# Patient Record
Sex: Female | Born: 1990 | Race: Asian | Hispanic: No | Marital: Married | Smoking: Never smoker
Health system: Southern US, Community
[De-identification: ages and names within clinical notes are randomized; demographics above are authoritative.]

## PROBLEM LIST (undated history)

## (undated) DIAGNOSIS — E079 Disorder of thyroid, unspecified: Secondary | ICD-10-CM

## (undated) DIAGNOSIS — E039 Hypothyroidism, unspecified: Secondary | ICD-10-CM

## (undated) HISTORY — PX: NO PAST SURGERIES: SHX2092

## (undated) HISTORY — DX: Disorder of thyroid, unspecified: E07.9

---

## 2014-08-23 ENCOUNTER — Ambulatory Visit (INDEPENDENT_AMBULATORY_CARE_PROVIDER_SITE_OTHER): Payer: PPO | Admitting: Physician Assistant

## 2014-08-23 VITALS — BP 112/70 | HR 73 | Temp 97.7°F | Ht 60.5 in | Wt 161.0 lb

## 2014-08-23 DIAGNOSIS — B9789 Other viral agents as the cause of diseases classified elsewhere: Secondary | ICD-10-CM

## 2014-08-23 DIAGNOSIS — J069 Acute upper respiratory infection, unspecified: Secondary | ICD-10-CM | POA: Diagnosis not present

## 2014-08-23 DIAGNOSIS — J029 Acute pharyngitis, unspecified: Secondary | ICD-10-CM

## 2014-08-23 LAB — POCT RAPID STREP A (OFFICE): RAPID STREP A SCREEN: NEGATIVE

## 2014-08-23 MED ORDER — NAPROXEN 500 MG PO TABS: 500.0000 mg | ORAL_TABLET | Freq: Two times a day (BID) | ORAL | Status: DC

## 2014-08-23 MED ORDER — CETIRIZINE-PSEUDOEPHEDRINE ER 5-120 MG PO TB12: 1.0000 | ORAL_TABLET | ORAL | Status: AC

## 2014-08-23 MED ORDER — HYDROCODONE-HOMATROPINE 5-1.5 MG/5ML PO SYRP: 5.0000 mL | ORAL_SOLUTION | Freq: Every day | ORAL | Status: DC

## 2014-08-23 NOTE — Progress Notes (Signed)
08/23/2014 at 8:21 PM  Sitara Raden / DOB: 1990-11-17 / MRN: [id number]  The patient  does not have a problem list on file.  SUBJECTIVE  Chief compalaint: Sore Throat   History of present illness: Ms. Boberg is 24 y.o. well appearing female presenting for viral uri symptoms that started yesterday.  She complains of sore throat, headache, and rhinorrhea, and nasal congestion and cough.  She denies fever, nausea, rigor, changes in appetite, or change in bowel or bladder.  She has tried aleve with mild relief of her throat pain.    She  has no past medical history on file.    She has a current medication list which includes the following prescription(s): naproxen sodium.  Ms. Schalk has No Known Allergies. She  reports that she has never smoked. She does not have any smokeless tobacco history on file. She reports that she does not drink alcohol or use illicit drugs. She  has no sexual activity history on file. The patient  has no past surgical history on file.  Her family history is not on file.  ROS  Per HPI   OBJECTIVE  Her  height is 5' 0.5" (1.537 m) and weight is 161 lb (73.029 kg). Her oral temperature is 97.7 F (36.5 C). Her blood pressure is 112/70 and her pulse is 73. Her oxygen saturation is 100%.  The patient's body mass index is 30.91 kg/(m^2).  Physical Exam  Constitutional: She is oriented to person, place, and time. She appears well-developed and well-nourished. No distress.  HENT:  Right Ear: Hearing, tympanic membrane, external ear and ear canal normal.  Left Ear: Hearing, tympanic membrane, external ear and ear canal normal.  Nose: Mucosal edema present. Right sinus exhibits no maxillary sinus tenderness and no frontal sinus tenderness. Left sinus exhibits no maxillary sinus tenderness and no frontal sinus tenderness.  Mouth/Throat: Uvula is midline, oropharynx is clear and moist and mucous membranes are normal.    Cardiovascular: Normal rate, regular rhythm  and normal heart sounds.   Respiratory: Effort normal and breath sounds normal. She has no wheezes. She has no rales.  Neurological: She is alert and oriented to person, place, and time.  Skin: Skin is warm and dry. She is not diaphoretic.  Psychiatric: She has a normal mood and affect.    Results for orders placed or performed in visit on 08/23/14 (from the past 24 hour(s))  POCT rapid strep A     Status: None   Collection Time: 08/23/14  8:00 PM  Result Value Ref Range   Rapid Strep A Screen Negative Negative    ASSESSMENT & PLAN  Lavonna was seen today for sore throat.  Diagnoses and all orders for this visit:  Sore throat: Culture pending.  Will call patient if results are positive and treat appropriately.   Orders: -     POCT rapid strep A -     Culture, Group A Strep -     naproxen (NAPROSYN) 500 MG tablet; Take 1 tablet (500 mg total) by mouth 2 (two) times daily with a meal.  Viral URI with cough Orders: -     HYDROcodone-homatropine (HYCODAN) 5-1.5 MG/5ML syrup; Take 5 mLs by mouth at bedtime. -     cetirizine-pseudoephedrine (ZYRTEC-D ALLERGY & CONGESTION) 5-120 MG per tablet; Take 1 tablet by mouth every morning. Do not use OTC cold remedies with this medication.    The patient was advised to call or come back to clinic if she does  not see an improvement in symptoms, or worsens with the above plan.   Deliah BostonMichael Clark, MHS, PA-C [facility] 08/23/2014 8:21 PM

## 2014-08-23 NOTE — Addendum Note (Signed)
Addended by: Ofilia NeasLARK, Lindy Pennisi L on: 08/23/2014 08:51 PM   Modules accepted: Level of Service

## 2014-08-23 NOTE — Patient Instructions (Signed)
Upper Respiratory Infection, Adult An upper respiratory infection (URI) is also sometimes known as the common cold. The upper respiratory tract includes the nose, sinuses, throat, trachea, and bronchi. Bronchi are the airways leading to the lungs. Most people improve within 1 week, but symptoms can last up to 2 weeks. A residual cough may last even longer.  CAUSES Many different viruses can infect the tissues lining the upper respiratory tract. The tissues become irritated and inflamed and often become very moist. Mucus production is also common. A cold is contagious. You can easily spread the virus to others by oral contact. This includes kissing, sharing a glass, coughing, or sneezing. Touching your mouth or nose and then touching a surface, which is then touched by another person, can also spread the virus. SYMPTOMS  Symptoms typically develop 1 to 3 days after you come in contact with a cold virus. Symptoms vary from person to person. They may include:  Runny nose.  Sneezing.  Nasal congestion.  Sinus irritation.  Sore throat.  Loss of voice (laryngitis).  Cough.  Fatigue.  Muscle aches.  Loss of appetite.  Headache.  Low-grade fever. DIAGNOSIS  You might diagnose your own cold based on familiar symptoms, since most people get a cold 2 to 3 times a year. Your caregiver can confirm this based on your exam. Most importantly, your caregiver can check that your symptoms are not due to another disease such as strep throat, sinusitis, pneumonia, asthma, or epiglottitis. Blood tests, throat tests, and X-rays are not necessary to diagnose a common cold, but they may sometimes be helpful in excluding other more serious diseases. Your caregiver will decide if any further tests are required. RISKS AND COMPLICATIONS  You may be at risk for a more severe case of the common cold if you smoke cigarettes, have chronic heart disease (such as heart failure) or lung disease (such as asthma), or if  you have a weakened immune system. The very young and very old are also at risk for more serious infections. Bacterial sinusitis, middle ear infections, and bacterial pneumonia can complicate the common cold. The common cold can worsen asthma and chronic obstructive pulmonary disease (COPD). Sometimes, these complications can require emergency medical care and may be life-threatening. PREVENTION  The best way to protect against getting a cold is to practice good hygiene. Avoid oral or hand contact with people with cold symptoms. Wash your hands often if contact occurs. There is no clear evidence that vitamin C, vitamin E, echinacea, or exercise reduces the chance of developing a cold. However, it is always recommended to get plenty of rest and practice good nutrition. TREATMENT  Treatment is directed at relieving symptoms. There is no cure. Antibiotics are not effective, because the infection is caused by a virus, not by bacteria. Treatment may include:  Increased fluid intake. Sports drinks offer valuable electrolytes, sugars, and fluids.  Breathing heated mist or steam (vaporizer or shower).  Eating chicken soup or other clear broths, and maintaining good nutrition.  Getting plenty of rest.  Using gargles or lozenges for comfort.  Controlling fevers with ibuprofen or acetaminophen as directed by your caregiver.  Increasing usage of your inhaler if you have asthma. Zinc gel and zinc lozenges, taken in the first 24 hours of the common cold, can shorten the duration and lessen the severity of symptoms. Pain medicines may help with fever, muscle aches, and throat pain. A variety of non-prescription medicines are available to treat congestion and runny nose. Your caregiver   can make recommendations and may suggest nasal or lung inhalers for other symptoms.  HOME CARE INSTRUCTIONS   Only take over-the-counter or prescription medicines for pain, discomfort, or fever as directed by your  caregiver.  Use a warm mist humidifier or inhale steam from a shower to increase air moisture. This may keep secretions moist and make it easier to breathe.  Drink enough water and fluids to keep your urine clear or pale yellow.  Rest as needed.  Return to work when your temperature has returned to normal or as your caregiver advises. You may need to stay home longer to avoid infecting others. You can also use a face mask and careful hand washing to prevent spread of the virus. SEEK MEDICAL CARE IF:   After the first few days, you feel you are getting worse rather than better.  You need your caregiver's advice about medicines to control symptoms.  You develop chills, worsening shortness of breath, or brown or red sputum. These may be signs of pneumonia.  You develop yellow or brown nasal discharge or pain in the face, especially when you bend forward. These may be signs of sinusitis.  You develop a fever, swollen neck glands, pain with swallowing, or white areas in the back of your throat. These may be signs of strep throat. SEEK IMMEDIATE MEDICAL CARE IF:   You have a fever.  You develop severe or persistent headache, ear pain, sinus pain, or chest pain.  You develop wheezing, a prolonged cough, cough up blood, or have a change in your usual mucus (if you have chronic lung disease).  You develop sore muscles or a stiff neck. Document Released: 12/03/2000 Document Revised: 09/01/2011 Document Reviewed: 09/14/2013 [employer] Patient Information 2015 [employer]. This information is not intended to replace advice given to you by your health care provider. Make sure you discuss any questions you have with your health care provider.  

## 2014-08-26 LAB — CULTURE, GROUP A STREP: ORGANISM ID, BACTERIA: NORMAL

## 2014-09-05 ENCOUNTER — Other Ambulatory Visit: Payer: Self-pay | Admitting: Obstetrics and Gynecology

## 2014-09-05 DIAGNOSIS — E049 Nontoxic goiter, unspecified: Secondary | ICD-10-CM

## 2014-09-06 ENCOUNTER — Other Ambulatory Visit: Payer: Self-pay

## 2014-09-08 ENCOUNTER — Inpatient Hospital Stay: Admission: RE | Admit: 2014-09-08 | Payer: Self-pay | Source: Ambulatory Visit

## 2014-09-25 ENCOUNTER — Ambulatory Visit
Admission: RE | Admit: 2014-09-25 | Discharge: 2014-09-25 | Disposition: A | Discharge status: Home / Self Care | Payer: PPO | Source: Ambulatory Visit | Attending: Obstetrics and Gynecology | Admitting: Obstetrics and Gynecology

## 2014-09-25 DIAGNOSIS — E049 Nontoxic goiter, unspecified: Secondary | ICD-10-CM

## 2015-02-04 ENCOUNTER — Emergency Department (INDEPENDENT_AMBULATORY_CARE_PROVIDER_SITE_OTHER)
Admission: EM | Admit: 2015-02-04 | Discharge: 2015-02-04 | Disposition: A | Discharge status: Home / Self Care | Payer: PPO | Source: Home / Self Care | Attending: Family Medicine | Admitting: Family Medicine

## 2015-02-04 ENCOUNTER — Encounter (HOSPITAL_COMMUNITY): Payer: Self-pay | Admitting: Emergency Medicine

## 2015-02-04 DIAGNOSIS — R21 Rash and other nonspecific skin eruption: Secondary | ICD-10-CM | POA: Diagnosis not present

## 2015-02-04 MED ORDER — PREDNISOLONE 5 MG (21) PO TBPK: 1.0000 | ORAL_TABLET | ORAL | Status: DC

## 2015-02-04 NOTE — ED Notes (Signed)
Pt comes in with bumps to hands/ feet and knees x 2 days Pt is taking Thyroid medication

## 2015-02-04 NOTE — ED Provider Notes (Signed)
CSN: [id number]     Arrival date & time 02/04/15  1923 History   First MD Initiated Contact with Patient 02/04/15 1929     Chief Complaint  Patient presents with  . Rash   (Consider location/radiation/quality/duration/timing/severity/associated sxs/prior Treatment) HPI Comments: Patient presents with a pruritic rash to both hands, arms, legs and feet. No known exposures. No new medications. Was working in yard, though not for long. Onset 2 days, very itchy. No additional symptoms and otherwise feels well. No prior history of rash.   Patient is a 24 y.o. female presenting with rash. The history is provided by the patient.  Rash   History reviewed. No pertinent past medical history. History reviewed. No pertinent past surgical history. No family history on file. Social History  Substance Use Topics  . Smoking status: Never Smoker   . Smokeless tobacco: None  . Alcohol Use: No   OB History    No data available     Review of Systems  Skin: Positive for rash.  All other systems reviewed and are negative.   Allergies  Review of patient's allergies indicates no known allergies.  Home Medications   Prior to Admission medications   Medication Sig Start Date End Date Taking? Authorizing Provider  HYDROcodone-homatropine (HYCODAN) 5-1.5 MG/5ML syrup Take 5 mLs by mouth at bedtime. 08/23/14   Ofilia Neas, PA-C  naproxen (NAPROSYN) 500 MG tablet Take 1 tablet (500 mg total) by mouth 2 (two) times daily with a meal. 08/23/14   Ofilia Neas, PA-C  naproxen sodium (ANAPROX) 220 MG tablet Take 220 mg by mouth 2 (two) times daily with a meal.    Historical Provider, MD  PrednisoLONE 5 MG (21) TBPK Take 1 Package by mouth as directed. Take daily as directed on package 02/04/15   Riki Sheer, PA-C   BP 104/73 mmHg  Pulse 94  Temp(Src) 99.1 F (37.3 C) (Oral)  Resp 16  SpO2 99% Physical Exam  Constitutional: She is oriented to person, place, and time. She appears well-developed  and well-nourished. No distress.  HENT:  Head: Normocephalic and atraumatic.  Neurological: She is alert and oriented to person, place, and time.  Skin: Skin is warm and dry. Rash noted. She is not diaphoretic.  Macular rash to hands, arms, legs and feet. No plaques or patches noted. No vesicles.   Psychiatric: Her behavior is normal.  Nursing note and vitals reviewed.   ED Course  Procedures (including critical care time) Labs Review Labs Reviewed - No data to display  Imaging Review No results found.   MDM   1. Rash and nonspecific skin eruption    Etiology unclear. Treat as a topical dermatitis with pred pack. If worsens or continues, suggest consult with a dermatologist.     Riki Sheer, PA-C 02/04/15 1956

## 2015-02-04 NOTE — Discharge Instructions (Signed)
Contact Dermatitis Contact dermatitis is a rash that happens when something touches the skin. You touched something that irritates your skin, or you have allergies to something you touched. HOME CARE   Avoid the thing that caused your rash.  Keep your rash away from hot water, soap, sunlight, chemicals, and other things that might bother it.  Do not scratch your rash.  You can take cool baths to help stop itching.  Only take medicine as told by your doctor.  Keep all doctor visits as told. GET HELP RIGHT AWAY IF:   Your rash is not better after 3 days.  Your rash gets worse.  Your rash is puffy (swollen), tender, red, sore, or warm.  You have problems with your medicine. MAKE SURE YOU:   Understand these instructions.  Will watch your condition.  Will get help right away if you are not doing well or get worse. Document Released: 04/06/2009 Document Revised: 09/01/2011 Document Reviewed: 11/12/2010 [facility] Patient Information 2015 [place], [place]. This information is not intended to replace advice given to you by your health care provider. Make sure you discuss any questions you have with your health care provider.  Complete your pred pack. Use Benadryl 25 mg every 4-6 hours for itching if needed. Hope this helps.

## 2015-05-26 ENCOUNTER — Ambulatory Visit (INDEPENDENT_AMBULATORY_CARE_PROVIDER_SITE_OTHER): Payer: PPO | Admitting: Family Medicine

## 2015-05-26 ENCOUNTER — Ambulatory Visit (INDEPENDENT_AMBULATORY_CARE_PROVIDER_SITE_OTHER): Payer: PPO

## 2015-05-26 VITALS — BP 110/65 | HR 120 | Temp 101.2°F | Resp 18 | Ht 61.0 in | Wt 164.6 lb

## 2015-05-26 DIAGNOSIS — R059 Cough, unspecified: Secondary | ICD-10-CM

## 2015-05-26 DIAGNOSIS — R05 Cough: Secondary | ICD-10-CM

## 2015-05-26 DIAGNOSIS — R509 Fever, unspecified: Secondary | ICD-10-CM | POA: Diagnosis not present

## 2015-05-26 LAB — POCT CBC
Granulocyte percent: 56.3 %G (ref 37–80)
HCT, POC: 37.9 % (ref 37.7–47.9)
Hemoglobin: 13.1 g/dL (ref 12.2–16.2)
Lymph, poc: 3.2 (ref 0.6–3.4)
MCH, POC: 28.7 pg (ref 27–31.2)
MCHC: 34.5 g/dL (ref 31.8–35.4)
MCV: 83.2 fL (ref 80–97)
MID (CBC): 0.6 (ref 0–0.9)
MPV: 8.4 fL (ref 0–99.8)
PLATELET COUNT, POC: 226 10*3/uL (ref 142–424)
POC Granulocyte: 4.9 (ref 2–6.9)
POC LYMPH %: 36.7 % (ref 10–50)
POC MID %: 7 %M (ref 0–12)
RBC: 4.56 M/uL (ref 4.04–5.48)
RDW, POC: 12.4 %
WBC: 8.7 10*3/uL (ref 4.6–10.2)

## 2015-05-26 LAB — POCT INFLUENZA A/B
INFLUENZA A, POC: NEGATIVE
INFLUENZA B, POC: NEGATIVE

## 2015-05-26 LAB — POCT RAPID STREP A (OFFICE): RAPID STREP A SCREEN: NEGATIVE

## 2015-05-26 MED ORDER — HYDROCODONE-HOMATROPINE 5-1.5 MG/5ML PO SYRP: 5.0000 mL | ORAL_SOLUTION | Freq: Three times a day (TID) | ORAL | Status: DC | PRN

## 2015-05-26 MED ORDER — ACETAMINOPHEN 325 MG PO TABS: 1000.0000 mg | ORAL_TABLET | Freq: Once | ORAL | Status: DC

## 2015-05-26 MED ORDER — IBUPROFEN 200 MG PO TABS: 400.0000 mg | ORAL_TABLET | Freq: Once | ORAL | Status: AC

## 2015-05-26 MED ORDER — CEFDINIR 300 MG PO CAPS: 300.0000 mg | ORAL_CAPSULE | Freq: Two times a day (BID) | ORAL | Status: DC

## 2015-05-26 NOTE — Patient Instructions (Signed)
Start on your antibiotic tonight and use the cough syrup as needed for cough Drink plenty of fluids  Use tylenol and / or ibuprofen to keep your fever down If you are not feeling a good bit better tomorrow please come back for a recheck- if you get worse overnight or if you are not able to drink go to the ER

## 2015-05-26 NOTE — Progress Notes (Signed)
[facility] [address], [place] [address] [phone]  Date:  05/26/2015   Name:  Cheryl Murray   DOB:  1991-03-11   MRN:  [id number]  PCP:  No PCP Per Patient    Chief Complaint: Cough and Headache   History of Present Illness:  Cheryl Murray is a 24 y.o. very pleasant female patient who presents with the following:  She has had a cough for about 10days, and then ha for 3 days Today she felt very tired and cold She has body aches Also sore throat  No antipyretics today so far  NKDA She took her thyroid medication only so far today  LMP 11/20 No belly pain or vomiting No urinary sx  There are no active problems to display for this patient.   Past Medical History  Diagnosis Date  . Thyroid disease     History reviewed. No pertinent past surgical history.  Social History  Substance Use Topics  . Smoking status: Never Smoker   . Smokeless tobacco: None  . Alcohol Use: No    History reviewed. No pertinent family history.  No Known Allergies  Medication list has been reviewed and updated.  No current outpatient prescriptions on file prior to visit.   No current facility-administered medications on file prior to visit.    Review of Systems:  As per HPI- otherwise negative.   Physical Examination: Filed Vitals:   05/26/15 1550  BP: 94/62  Pulse: 133  Temp: 103.1 F (39.5 C)  Resp: 18   Filed Vitals:   05/26/15 1550  Height: 5\' 1"  (1.549 m)  Weight: 164 lb 9.6 oz (74.662 kg)   Body mass index is 31.12 kg/(m^2). Ideal Body Weight: Weight in (lb) to have BMI = 25: 132  GEN: WDWN, NAD, Non-toxic, A & O x 3, coughing, febrile HEENT: Atraumatic, Normocephalic. Neck supple. No masses, No LAD.  Bilateral TM wnl, oropharynx normal.  PEERL,EOMI.   Ears and Nose: No external deformity. CV: RRR, No M/G/R. No JVD. No thrill. No extra heart sounds. PULM: CTA B, no wheezes, crackles, rhonchi. No retractions. No resp. distress.  No accessory muscle use. ABD: S, NT, ND, +BS. No rebound. No HSM. EXTR: No c/c/e NEURO Normal gait.  PSYCH: Normally interactive. Conversant. Not depressed or anxious appearing.  Calm demeanor.   Treated wht 400 mg of ibuprofen and 1,000 mg of tylenol po in clinic.    Results for orders placed or performed in visit on 05/26/15  POCT Influenza A/B  Result Value Ref Range   Influenza A, POC Negative Negative   Influenza B, POC Negative Negative  POCT rapid strep A  Result Value Ref Range   Rapid Strep A Screen Negative Negative  POCT CBC  Result Value Ref Range   WBC 8.7 4.6 - 10.2 K/uL   Lymph, poc 3.2 0.6 - 3.4   POC LYMPH PERCENT 36.7 10 - 50 %L   MID (cbc) 0.6 0 - 0.9   POC MID % 7.0 0 - 12 %M   POC Granulocyte 4.9 2 - 6.9   Granulocyte percent 56.3 37 - 80 %G   RBC 4.56 4.04 - 5.48 M/uL   Hemoglobin 13.1 12.2 - 16.2 g/dL   HCT, POC 69.637.9 29.537.7 - 47.9 %   MCV 83.2 80 - 97 fL   MCH, POC 28.7 27 - 31.2 pg   MCHC 34.5 31.8 - 35.4 g/dL   RDW, POC 28.412.4 %   Platelet Count,  POC 226 142 - 424 K/uL   MPV 8.4 0 - 99.8 fL    [facility] reading (PRIMARY) by  Dr. Patsy Lager. CXR:  Negative   CHEST 2 VIEW  COMPARISON: None.  FINDINGS: The heart size and mediastinal contours are normal. There is patchy left basilar airspace opacity, best seen on the frontal examination. There is no obscuration of the left heart border, and this is likely in the lower lobe. The right lung is clear. There is no pleural effusion or pneumothorax. No acute osseous findings are seen.  IMPRESSION: Patchy left lower lobe airspace disease suspicious for early pneumonia.  Assessment and Plan: Cough - Plan: POCT Influenza A/B, POCT rapid strep A, POCT CBC, DG Chest 2 View, cefdinir (OMNICEF) 300 MG capsule, HYDROcodone-homatropine (HYCODAN) 5-1.5 MG/5ML syrup  Fever, unspecified - Plan: acetaminophen (TYLENOL) tablet 975 mg, ibuprofen (ADVIL,MOTRIN) tablet 400 mg  Here today with high fever, malaise.   Suspicious for flu but test is negative Decided to cover with omnicef in case bacterial origin.   Received CXR over-read as above- continue omnicef for possible pneumonia   Signed Abbe Amsterdam, MD

## 2015-05-27 ENCOUNTER — Telehealth: Payer: Self-pay | Admitting: Family Medicine

## 2015-05-27 NOTE — Telephone Encounter (Signed)
Called and spoke with pt and her husband.  She is a bit better.  Explained that she does have pneumonia.  They will continue the abx

## 2015-05-28 ENCOUNTER — Telehealth: Payer: Self-pay

## 2015-05-28 NOTE — Telephone Encounter (Signed)
Aneima -

## 2015-06-02 ENCOUNTER — Ambulatory Visit (INDEPENDENT_AMBULATORY_CARE_PROVIDER_SITE_OTHER): Payer: PPO | Admitting: Physician Assistant

## 2015-06-02 VITALS — BP 108/68 | HR 107 | Temp 98.0°F | Resp 16 | Ht 61.0 in | Wt 164.0 lb

## 2015-06-02 DIAGNOSIS — R059 Cough, unspecified: Secondary | ICD-10-CM

## 2015-06-02 DIAGNOSIS — R05 Cough: Secondary | ICD-10-CM | POA: Diagnosis not present

## 2015-06-02 MED ORDER — HYDROCOD POLST-CPM POLST ER 10-8 MG/5ML PO SUER: 5.0000 mL | Freq: Every evening | ORAL | Status: AC | PRN

## 2015-06-02 NOTE — Progress Notes (Signed)
[facility] [address], [place] [address] [phone]  Date:  06/02/2015   Name:  Cheryl Murray   DOB:  Sep 27, 1990   MRN:  [id number]  PCP:  No PCP Per Patient    History of Present Illness:  Cheryl Murray is a 24 y.o. female patient who [facility]. She was seen here 7 days ago for fever, cough, and with cxr with possible pneumonia.  Given cefdinir.  She states that she continues to have cough.  She has some fatigue, but is also managing with a sick 25 year old.  Cough is non-productive.  No sob or dyspnea.  No fever.  She states the hycodan does not settle her cough.     There are no active problems to display for this patient.   Past Medical History  Diagnosis Date  . Thyroid disease     History reviewed. No pertinent past surgical history.  Social History  Substance Use Topics  . Smoking status: Never Smoker   . Smokeless tobacco: Never Used  . Alcohol Use: No    History reviewed. No pertinent family history.  No Known Allergies  Medication list has been reviewed and updated.  Current Outpatient Prescriptions on File Prior to Visit  Medication Sig Dispense Refill  . cefdinir (OMNICEF) 300 MG capsule Take 1 capsule (300 mg total) by mouth 2 (two) times daily. 20 capsule 0  . levothyroxine (SYNTHROID, LEVOTHROID) 112 MCG tablet Take 112 mcg by mouth daily before breakfast.     Current Facility-Administered Medications on File Prior to Visit  Medication Dose Route Frequency Provider Last Rate Last Dose  . acetaminophen (TYLENOL) tablet 975 mg  975 mg Oral Once Jessica C Copland, MD      . ibuprofen (ADVIL,MOTRIN) tablet 400 mg  400 mg Oral Once Jessica C Copland, MD        ROS ROS otherwise unremarkable unless listed above.   Physical Examination: BP 108/68 mmHg  Pulse 107  Temp(Src) 98 F (36.7 C) (Oral)  Resp 16  Ht 5\' 1"  (1.549 m)  Wt 164 lb (74.39 kg)  BMI 31.00 kg/m2  SpO2 97%  LMP  05/13/2015 Ideal Body Weight: Weight in (lb) to have BMI = 25: 132  Physical Exam  Constitutional: She is oriented to person, place, and time. She appears well-developed and well-nourished. No distress.  HENT:  Head: Normocephalic and atraumatic.  Right Ear: Tympanic membrane, external ear and ear canal normal.  Left Ear: Tympanic membrane, external ear and ear canal normal.  Nose: No mucosal edema or rhinorrhea. Right sinus exhibits no maxillary sinus tenderness and no frontal sinus tenderness. Left sinus exhibits no maxillary sinus tenderness and no frontal sinus tenderness.  Mouth/Throat: No uvula swelling. No oropharyngeal exudate, posterior oropharyngeal edema or posterior oropharyngeal erythema.  Eyes: Conjunctivae and EOM are normal. Pupils are equal, round, and reactive to light.  Cardiovascular: Normal rate and regular rhythm.  Exam reveals no gallop, no distant heart sounds and no friction rub.   No murmur heard. Pulmonary/Chest: Effort normal. No respiratory distress. She has no decreased breath sounds. She has no wheezes. She has no rhonchi.  Lymphadenopathy:       Head (right side): No submandibular, no tonsillar, no preauricular and no posterior auricular adenopathy present.       Head (left side): No submandibular, no tonsillar, no preauricular and no posterior auricular adenopathy present.    She has no cervical adenopathy.  Neurological: She is alert and oriented to person, place, and time.  Skin: She is not diaphoretic.  Psychiatric: She has a normal mood and affect. Her behavior is normal.     Assessment and Plan: Cheryl Murray is a 24 y.o. female who is here today for follow up of respiratory infection consistent with pneumonia.  This appears to be residual coughing.  I have changed to tussionex.  Advised to return if her sxs do not imrpove within the next 7-10 days.  Alarming sxs discussed.   Cough - Plan: chlorpheniramine-HYDROcodone (TUSSIONEX PENNKINETIC ER) 10-8  MG/5ML SUER   Trena Platt, PA-C [facility] 06/02/2015 4:36 PM

## 2015-10-10 ENCOUNTER — Encounter: Payer: PPO | Admitting: Obstetrics & Gynecology

## 2016-02-21 LAB — OB RESULTS CONSOLE ANTIBODY SCREEN: Antibody Screen: NEGATIVE

## 2016-02-21 LAB — OB RESULTS CONSOLE GC/CHLAMYDIA
Chlamydia: NEGATIVE
GC PROBE AMP, GENITAL: NEGATIVE

## 2016-02-21 LAB — OB RESULTS CONSOLE ABO/RH: RH Type: POSITIVE

## 2016-02-21 LAB — OB RESULTS CONSOLE HEPATITIS B SURFACE ANTIGEN: Hepatitis B Surface Ag: NEGATIVE

## 2016-02-21 LAB — OB RESULTS CONSOLE HIV ANTIBODY (ROUTINE TESTING): HIV: NONREACTIVE

## 2016-02-21 LAB — OB RESULTS CONSOLE RUBELLA ANTIBODY, IGM: Rubella: IMMUNE

## 2016-06-23 NOTE — L&D Delivery Note (Signed)
Delivery Note  First Stage: Labor onset: 1333 Augmentation : Pitocin augmentation until transition then discontinued  Analgesia /Anesthesia intrapartum: Stadol IV  SROM at 0600- clear amniotic fluid   Second Stage: Complete dilation at 1523 Involuntary pushing at 1520 FHR second stage Cat 1  Delivery of a viable female at 151526 by SNM in direct OA position with left arm  no nuchal cord Cord double clamped after cessation of pulsation, cut by FOB Cord blood sample collected   Third Stage: [facility] intact with 3 VC @ 1531 Placenta disposition: L&D Uterine tone firm / bleeding small  1st degree perineal laceration identified  Anesthesia for repair: lidocaine local  Repair 3.0 vicryl rapide  Est. Blood Loss (mL): 150  Complications: none  Mom to postpartum.  Baby to Couplet care / Skin to Skin.  Newborn: Baby Name: Cheryl Murray  Birth Weight: pending  Apgar Scores: 9/9 Feeding planned: breast   Steward DroneVeronica Nazareth Norenberg BSN, SNM 09/10/2016, 3:52 PM

## 2016-06-28 LAB — OB RESULTS CONSOLE RPR: RPR: NONREACTIVE

## 2016-08-23 IMAGING — US US SOFT TISSUE HEAD/NECK
1 series · 13 of 25 positions shown · non-contrast
Comparison: None.

CLINICAL DATA: Thyroid enlargement by physical examination. History
of hypothyroidism.

EXAM:
THYROID ULTRASOUND
TECHNIQUE: Ultrasound examination of the thyroid gland and adjacent soft
tissues was performed.

[Series 1: us soft tissue head/neck · 0.10mm/px · 13 of 57 slices shown]
[im 1/57]
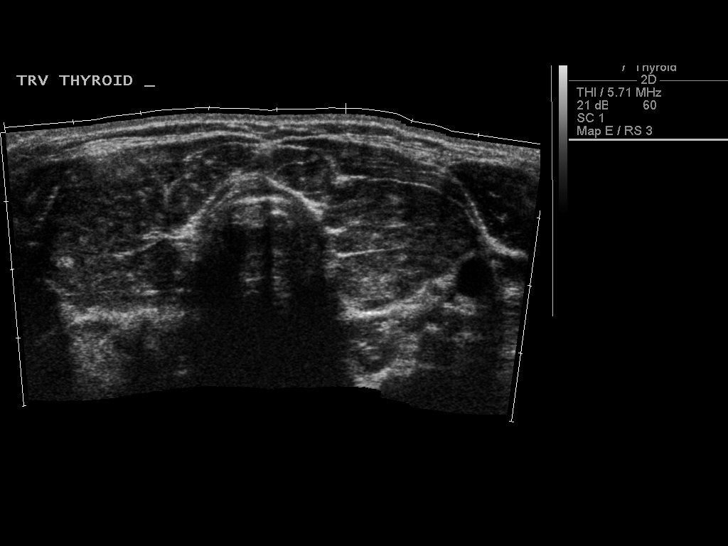
[im 5/57]
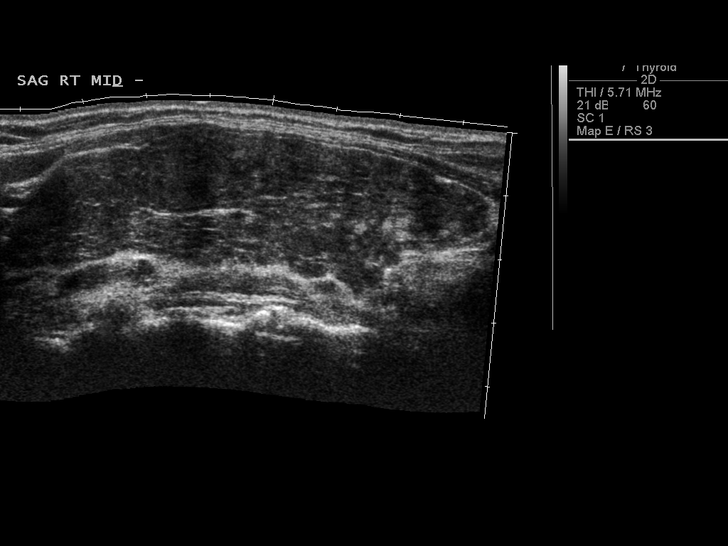
[im 10/57]
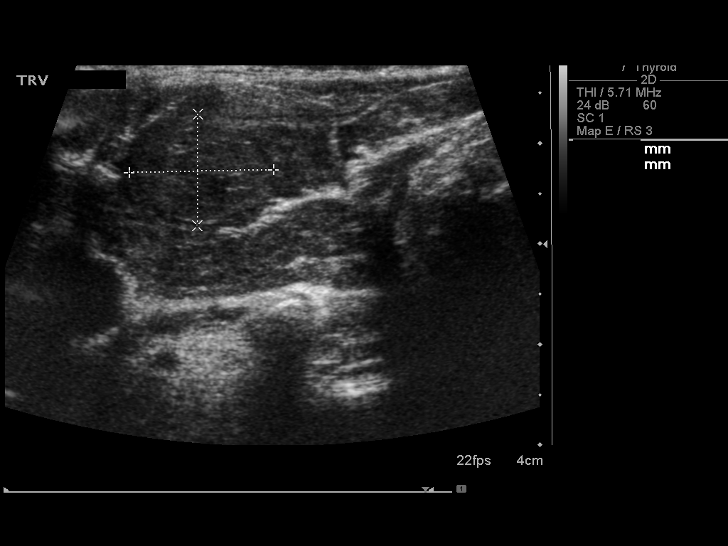
[im 15/57]
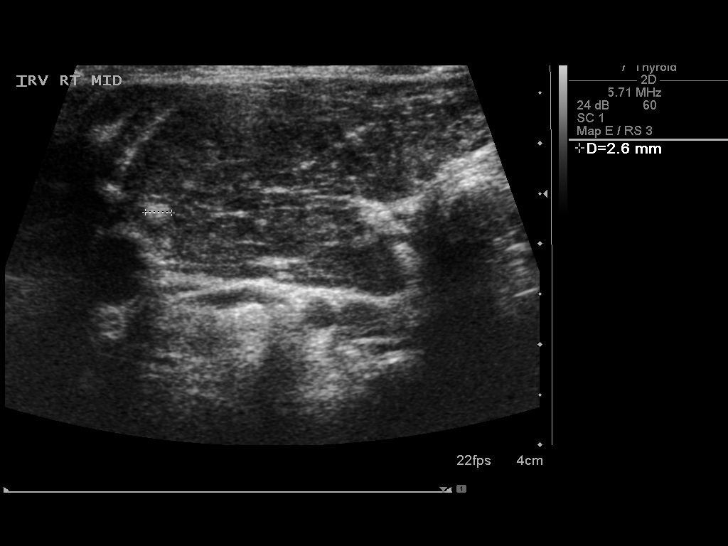
[im 19/57]
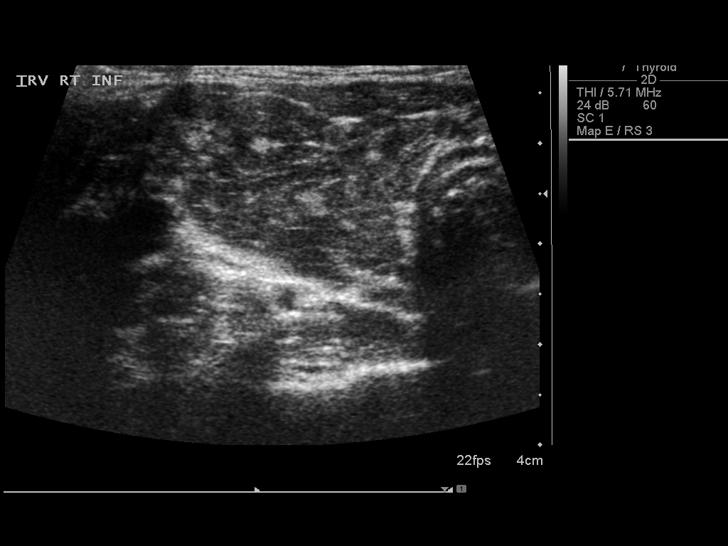
[im 24/57]
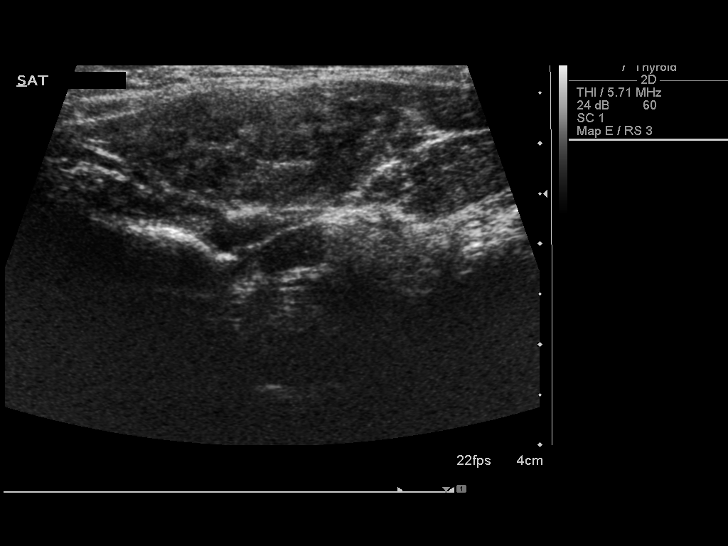
[im 29/57]
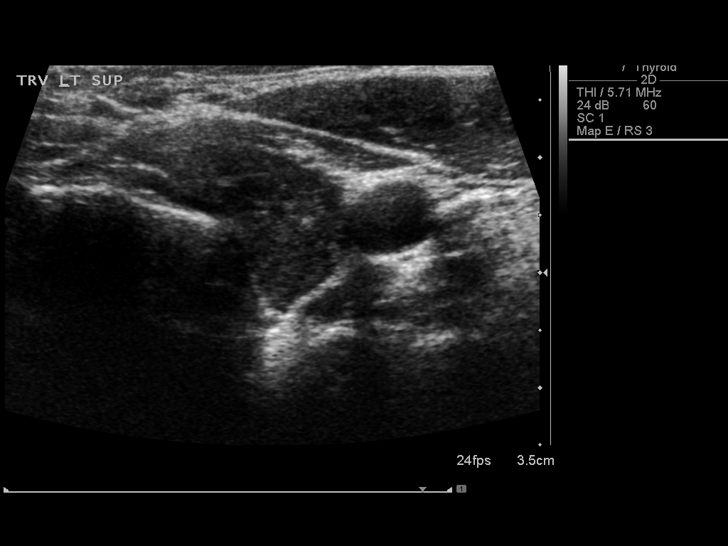
[im 33/57]
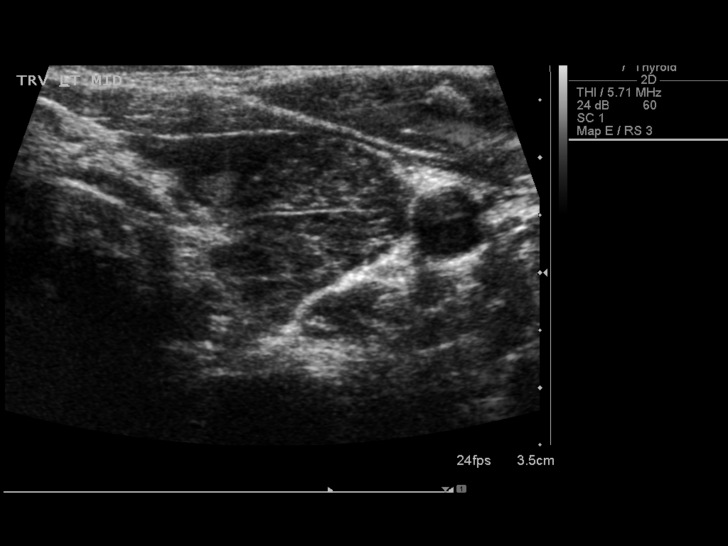
[im 38/57]
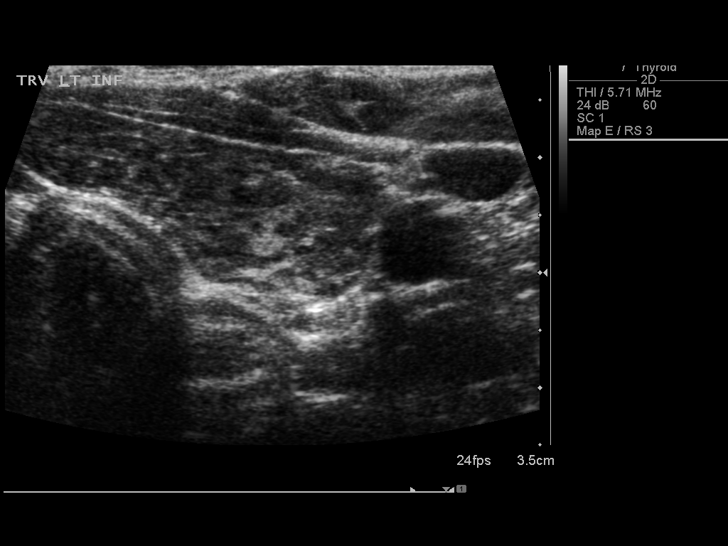
[im 43/57]
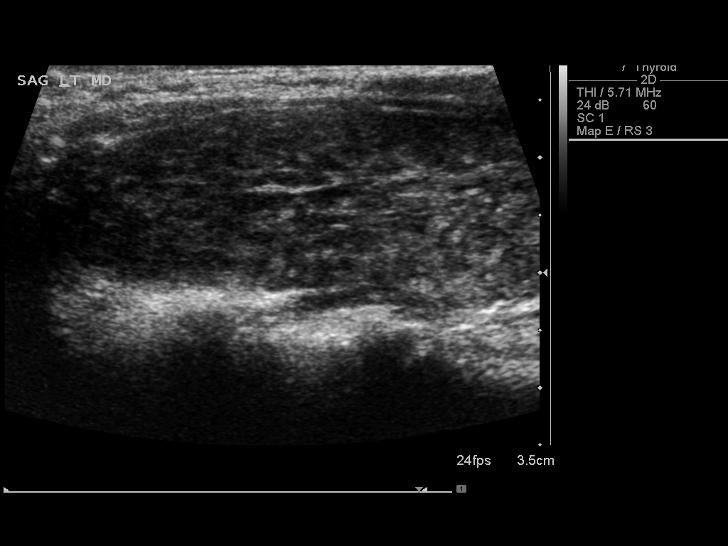
[im 47/57]
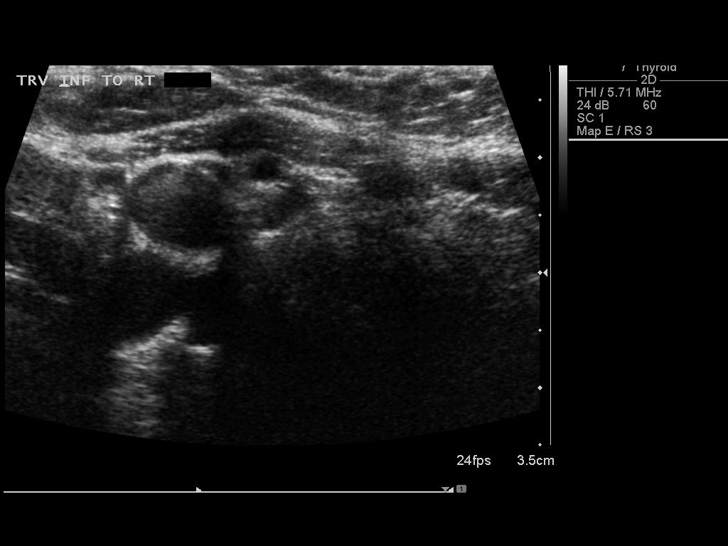
[im 52/57]
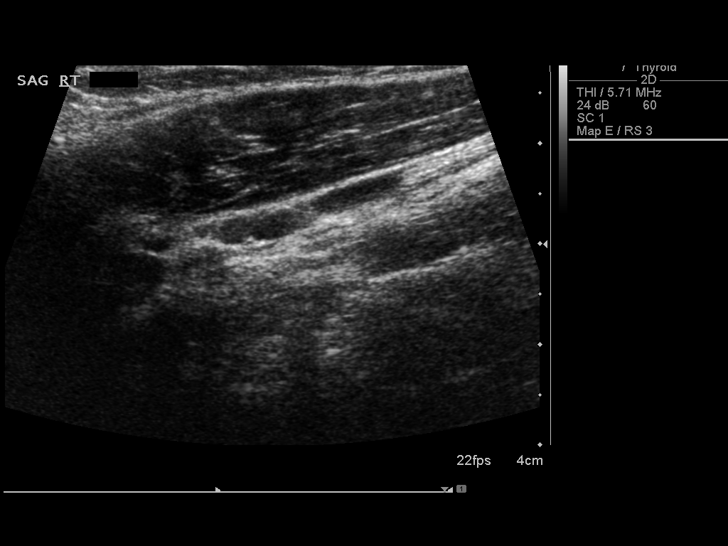
[im 57/57]
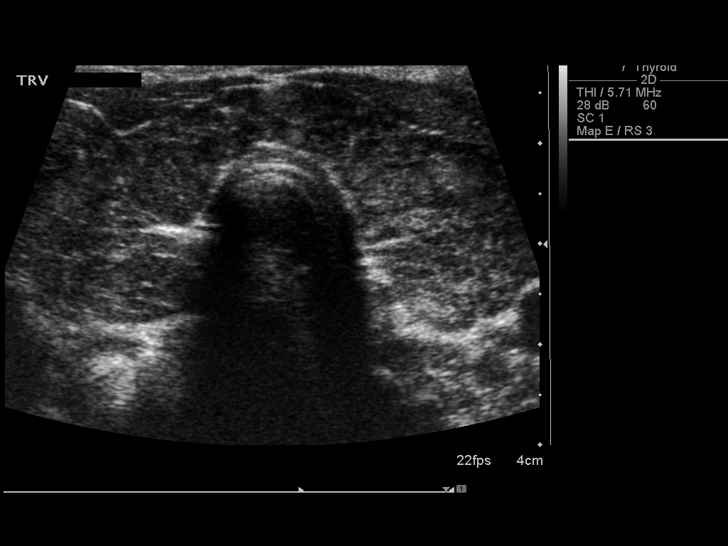

[13 of 25 positions shown; findings below may reference images not displayed]

FINDINGS: Right thyroid lobe

Measurements: 7.6 x 2.3 x 2.7 cm. The right lobe is moderately
enlarged and shows very heterogeneous echotexture. A vaguely
marginated area of nodularity is present in the mid right lobe which
is outlined by some vascularity and measures approximately 1.3 x
x 1.3 cm. This area of nodularity is noncalcified. Multiple
scattered subcentimeter areas of hyperechoic nodularity are
consistent with small spared areas of thyroid tissue in a background
of probable chronic thyroiditis and are typical of benign "Gervais
Metz" nodules.

Left thyroid lobe

Measurements: 5.6 x 2.0 x 2.4 cm. The left lobe also shows very
heterogeneous echotexture with multiple tiny subcentimeter areas of
hyperechoic nodularity. The largest measures 0.3 cm. These are also
typical of benign "[place]" nodules in the setting of chronic
thyroiditis.

Isthmus

Thickness: 0.7 cm.  No nodules visualized.

Lymphadenopathy

None visualized.
IMPRESSION: Heterogeneous thyroid gland with enlargement of both lobes, right
greater than left. Appearance is typical of chronic thyroiditis.
There is a vaguely marginated nodule in the right lobe measuring
cm in greatest diameter. This does not meet size criteria for
biopsy.

Findings do not meet current SRU consensus criteria for biopsy.
Follow-up by clinical exam is recommended. If patient has known risk
factors for thyroid carcinoma, consider follow-up ultrasound in 12
months. If patient is clinically hyperthyroid, consider nuclear
medicine thyroid uptake and scan.Reference: Management of Thyroid
Nodules Detected at US: Society of Radiologists in Ultrasound

## 2016-09-10 ENCOUNTER — Encounter (HOSPITAL_COMMUNITY): Payer: Self-pay | Admitting: *Deleted

## 2016-09-10 ENCOUNTER — Inpatient Hospital Stay (HOSPITAL_COMMUNITY)
Admission: AD | Admit: 2016-09-10 | Discharge: 2016-09-11 | DRG: 775 | Disposition: A | Discharge status: Home / Self Care | Payer: PPO | Source: Ambulatory Visit | Attending: Obstetrics | Admitting: Obstetrics

## 2016-09-10 DIAGNOSIS — O99284 Endocrine, nutritional and metabolic diseases complicating childbirth: Secondary | ICD-10-CM | POA: Diagnosis present

## 2016-09-10 DIAGNOSIS — O4202 Full-term premature rupture of membranes, onset of labor within 24 hours of rupture: Secondary | ICD-10-CM | POA: Diagnosis present

## 2016-09-10 DIAGNOSIS — D649 Anemia, unspecified: Secondary | ICD-10-CM | POA: Diagnosis present

## 2016-09-10 DIAGNOSIS — Z3A39 39 weeks gestation of pregnancy: Secondary | ICD-10-CM | POA: Diagnosis not present

## 2016-09-10 DIAGNOSIS — O9902 Anemia complicating childbirth: Secondary | ICD-10-CM | POA: Diagnosis present

## 2016-09-10 DIAGNOSIS — E039 Hypothyroidism, unspecified: Secondary | ICD-10-CM | POA: Diagnosis present

## 2016-09-10 DIAGNOSIS — O99280 Endocrine, nutritional and metabolic diseases complicating pregnancy, unspecified trimester: Secondary | ICD-10-CM

## 2016-09-10 HISTORY — DX: Hypothyroidism, unspecified: E03.9

## 2016-09-10 LAB — CBC
HEMATOCRIT: 33.7 % — AB (ref 36.0–46.0)
HEMOGLOBIN: 11.6 g/dL — AB (ref 12.0–15.0)
MCH: 31.3 pg (ref 26.0–34.0)
MCHC: 34.4 g/dL (ref 30.0–36.0)
MCV: 90.8 fL (ref 78.0–100.0)
Platelets: 212 10*3/uL (ref 150–400)
RBC: 3.71 MIL/uL — AB (ref 3.87–5.11)
RDW: 13.5 % (ref 11.5–15.5)
WBC: 8.9 10*3/uL (ref 4.0–10.5)

## 2016-09-10 LAB — TYPE AND SCREEN
ABO/RH(D): A POS
ANTIBODY SCREEN: NEGATIVE

## 2016-09-10 LAB — GROUP B STREP BY PCR: GROUP B STREP BY PCR: NEGATIVE

## 2016-09-10 LAB — ABO/RH: ABO/RH(D): A POS

## 2016-09-10 LAB — POCT FERN TEST: POCT FERN TEST: POSITIVE

## 2016-09-10 LAB — OB RESULTS CONSOLE GBS: STREP GROUP B AG: NEGATIVE

## 2016-09-10 MED ORDER — LACTATED RINGERS IV SOLN
INTRAVENOUS | Status: DC
  Administered 2016-09-10: 10:00:00 via INTRAVENOUS

## 2016-09-10 MED ORDER — LIDOCAINE HCL (PF) 1 % IJ SOLN
30.0000 mL | INTRAMUSCULAR | Status: DC | PRN
  Administered 2016-09-10: 30 mL via SUBCUTANEOUS
  Filled 2016-09-10: qty 30

## 2016-09-10 MED ORDER — ACETAMINOPHEN 325 MG PO TABS: 650.0000 mg | ORAL_TABLET | ORAL | Status: DC | PRN

## 2016-09-10 MED ORDER — OXYTOCIN 40 UNITS IN LACTATED RINGERS INFUSION - SIMPLE MED: 2.5000 [IU]/h | INTRAVENOUS | Status: DC

## 2016-09-10 MED ORDER — WITCH HAZEL-GLYCERIN EX PADS: 1.0000 "application " | MEDICATED_PAD | CUTANEOUS | Status: DC | PRN

## 2016-09-10 MED ORDER — TERBUTALINE SULFATE 1 MG/ML IJ SOLN
0.2500 mg | Freq: Once | INTRAMUSCULAR | Status: DC | PRN
  Filled 2016-09-10: qty 1

## 2016-09-10 MED ORDER — PRENATAL MULTIVITAMIN CH
1.0000 | ORAL_TABLET | Freq: Every day | ORAL | Status: DC
  Administered 2016-09-11: 1 via ORAL
  Filled 2016-09-10: qty 1

## 2016-09-10 MED ORDER — SENNOSIDES-DOCUSATE SODIUM 8.6-50 MG PO TABS
2.0000 | ORAL_TABLET | ORAL | Status: DC
  Administered 2016-09-10: 2 via ORAL
  Filled 2016-09-10: qty 2

## 2016-09-10 MED ORDER — OXYTOCIN BOLUS FROM INFUSION
500.0000 mL | Freq: Once | INTRAVENOUS | Status: AC
  Administered 2016-09-10: 500 mL via INTRAVENOUS

## 2016-09-10 MED ORDER — ONDANSETRON HCL 4 MG/2ML IJ SOLN: 4.0000 mg | INTRAMUSCULAR | Status: DC | PRN

## 2016-09-10 MED ORDER — INFLUENZA VAC SPLIT QUAD 0.5 ML IM SUSY: 0.5000 mL | PREFILLED_SYRINGE | INTRAMUSCULAR | Status: DC

## 2016-09-10 MED ORDER — IBUPROFEN 600 MG PO TABS
600.0000 mg | ORAL_TABLET | Freq: Four times a day (QID) | ORAL | Status: DC
  Administered 2016-09-10 – 2016-09-11 (×4): 600 mg via ORAL
  Filled 2016-09-10 (×4): qty 1

## 2016-09-10 MED ORDER — ZOLPIDEM TARTRATE 5 MG PO TABS: 5.0000 mg | ORAL_TABLET | Freq: Every evening | ORAL | Status: DC | PRN

## 2016-09-10 MED ORDER — SOD CITRATE-CITRIC ACID 500-334 MG/5ML PO SOLN: 30.0000 mL | ORAL | Status: DC | PRN

## 2016-09-10 MED ORDER — ONDANSETRON HCL 4 MG/2ML IJ SOLN: 4.0000 mg | Freq: Four times a day (QID) | INTRAMUSCULAR | Status: DC | PRN

## 2016-09-10 MED ORDER — BUTORPHANOL TARTRATE 1 MG/ML IJ SOLN
1.0000 mg | INTRAMUSCULAR | Status: DC | PRN
  Administered 2016-09-10 (×2): 1 mg via INTRAVENOUS
  Filled 2016-09-10 (×2): qty 1

## 2016-09-10 MED ORDER — DIBUCAINE 1 % RE OINT: 1.0000 "application " | TOPICAL_OINTMENT | RECTAL | Status: DC | PRN

## 2016-09-10 MED ORDER — SIMETHICONE 80 MG PO CHEW: 80.0000 mg | CHEWABLE_TABLET | ORAL | Status: DC | PRN

## 2016-09-10 MED ORDER — MEASLES, MUMPS & RUBELLA VAC ~~LOC~~ INJ
0.5000 mL | INJECTION | Freq: Once | SUBCUTANEOUS | Status: DC
  Filled 2016-09-10: qty 0.5

## 2016-09-10 MED ORDER — LACTATED RINGERS IV SOLN: 500.0000 mL | INTRAVENOUS | Status: DC | PRN

## 2016-09-10 MED ORDER — VARICELLA VIRUS VACCINE LIVE 1350 PFU/0.5ML IJ SUSR: 0.5000 mL | Freq: Once | INTRAMUSCULAR | Status: DC

## 2016-09-10 MED ORDER — OXYCODONE-ACETAMINOPHEN 5-325 MG PO TABS: 1.0000 | ORAL_TABLET | ORAL | Status: DC | PRN

## 2016-09-10 MED ORDER — OXYCODONE-ACETAMINOPHEN 5-325 MG PO TABS: 2.0000 | ORAL_TABLET | ORAL | Status: DC | PRN

## 2016-09-10 MED ORDER — LEVOTHYROXINE SODIUM 112 MCG PO TABS
112.0000 ug | ORAL_TABLET | Freq: Every day | ORAL | Status: DC
  Administered 2016-09-11: 112 ug via ORAL
  Filled 2016-09-10 (×2): qty 1

## 2016-09-10 MED ORDER — OXYTOCIN 40 UNITS IN LACTATED RINGERS INFUSION - SIMPLE MED
1.0000 m[IU]/min | INTRAVENOUS | Status: DC
  Administered 2016-09-10: 2 m[IU]/min via INTRAVENOUS
  Filled 2016-09-10: qty 1000

## 2016-09-10 MED ORDER — OXYTOCIN 40 UNITS IN LACTATED RINGERS INFUSION - SIMPLE MED: 2.5000 [IU]/h | INTRAVENOUS | Status: DC | PRN

## 2016-09-10 MED ORDER — BENZOCAINE-MENTHOL 20-0.5 % EX AERO
1.0000 "application " | INHALATION_SPRAY | CUTANEOUS | Status: DC | PRN
  Administered 2016-09-11: 1 via TOPICAL
  Filled 2016-09-10 (×2): qty 56

## 2016-09-10 MED ORDER — FLEET ENEMA 7-19 GM/118ML RE ENEM: 1.0000 | ENEMA | RECTAL | Status: DC | PRN

## 2016-09-10 MED ORDER — TETANUS-DIPHTH-ACELL PERTUSSIS 5-2.5-18.5 LF-MCG/0.5 IM SUSP: 0.5000 mL | Freq: Once | INTRAMUSCULAR | Status: DC

## 2016-09-10 MED ORDER — COCONUT OIL OIL: 1.0000 "application " | TOPICAL_OIL | Status: DC | PRN

## 2016-09-10 MED ORDER — ONDANSETRON HCL 4 MG PO TABS: 4.0000 mg | ORAL_TABLET | ORAL | Status: DC | PRN

## 2016-09-10 NOTE — MAU Note (Signed)
Pt had gush of fluid @ 0600, continues leaking now, clear fluid.  Denies bleeding.  Also having contractions this morning.

## 2016-09-10 NOTE — Lactation Note (Addendum)
This note was copied from a baby's chart. Lactation Consultation Note  Patient Name: Cheryl Murray ZOXWR'UToday's Date: 09/10/2016 Reason for consult: Initial assessment   Initial consult with Exp. [facility] <[facility] Parents declined interpreter and communicated well in [place].  Mom reports she Bf her 27 yo for 2 months. Dad reports they gave formula in the hospital as they did not know it would affect mom's supply. Dad reports when mom returned to work, mom was not pumping and milk supply diminished and mom stopped BF. Parents report they plan to exclusively BF with this infant.   Infant was latched to left breast in the cradle hold, infant actively nursing, infant came off independently and mom asked for assistance in latching infant to right breast. Infant latched with assistance in the cross cradle hold and was actively nursing when I left the room. Mom denied pain/pinching with feeding. Mom holding breast with a cigarette hold, enc mom to use "C" hold and to not push in on breast for breathing space as well as intermittently compressing breast with feeding, mom initially changed hand and then mom returned to holding breast in the cigarette hold. Enc mom to use pillow/head support with feeding. Enc mom to feed infant STS 8-12 x in 24 hours at first feeding cues. Feeding log given with instructions for use. Mom with soft compressible breasts and areola with everted nipples. Colostrum easily expressible.   BF basics, pillow support, Colostrum, milk coming to volume, supply and demand, cluster feeding, hand expression, and NB nutritional needs reviewed with parents. BF Resources handout and LC Brochure given, mom informed of IP/OP Services, BF Support Groups and LC phone #. Mom has ordered a Medela Pump from [employer]. [facility] and is aware to call and make appt post d/c. Parents without further questions/concerns. Enc mom to call out for feeding assitance as needed.       Maternal Data Formula Feeding for Exclusion: No Has patient been taught Hand Expression?: Yes Does the patient have breastfeeding experience prior to this delivery?: Yes  Feeding Feeding Type: Breast Fed Length of feed: 15 min  LATCH Score/Interventions Latch: Grasps breast easily, tongue down, lips flanged, rhythmical sucking.  Audible Swallowing: A few with stimulation Intervention(s): Skin to skin;Hand expression;Alternate breast massage  Type of Nipple: Everted at rest and after stimulation  Comfort (Breast/Nipple): Soft / non-tender     Hold (Positioning): Assistance needed to correctly position infant at breast and maintain latch. Intervention(s): Breastfeeding basics reviewed;Support Pillows;Position options;Skin to skin  LATCH Score: 8  Lactation Tools Discussed/Used WIC Program: Yes   Consult Status Consult Status: Follow-up Date: 09/11/16 Follow-up type: In-patient    Silas FloodSharon S Sary Bogie 09/10/2016, 4:29 PM

## 2016-09-10 NOTE — Progress Notes (Signed)
Patient ID: Cheryl Murray, female   DOB: [birth year], 26 y.o.   MRN: [id number]  I have discussed this patient with Dr. Ernestina PennaFogleman I introduced myself to patient and husband. They stated that their preference for a female is "100%" Husband stated that his wife is "very uncomfortable with a female provider." Discussed with Dr. Ernestina PennaFogleman. She will assume care of patient at this time.

## 2016-09-10 NOTE — Progress Notes (Signed)
S: Patient sleepy from stadol waking up with contractions- states she wants a natural birth with no epidural   O:  Vitals:   09/10/16 1304 09/10/16 1338 09/10/16 1442 09/10/16 1545  BP: (!) 96/55 (!) 105/57 105/61 108/60  Pulse: 63 66 74 78  Resp: 18 17  18   Temp:  98.3 F (36.8 C)    TempSrc:  Oral    Weight:      Height:        Dilation: 7.5 Effacement (%): 100 Cervical Position: Middle Station: -1, 0 Presentation: Vertex Exam by:: Montez MoritaErin Hampton, RNC  Palpation of strong contractions- pitocin discontinued for labor  Intermittent monitoring for movement   A: Active labor- FHT Cat 1  P: Expectant management      SNM support for transition and active stage

## 2016-09-10 NOTE — H&P (Signed)
Cheryl Murray is a 26 y.o. G2P1002 at [redacted]w[redacted]d presenting for ROM. Pt notes mild contractions. Good fetal movement, No vaginal bleeding, started leaking fluid at 6am.  [facility] 6 w[facility] - Dated by LMP c/w 6 wk u/s - varicella N-I, recc varicella PP - mild anemia - LSIL pap. Pt understands needs repeat PP - hypothyroidism, on synthroid, needed dose adjusments, last checked at 28 wks - Non-compliance- pt's last visit at 32 wks on 1/31. Pt states she "did not know" she needed additional visits and that "time really flies". Pt no showed then cancelled and when office called pt to reschedule pt states she couldn't schedule at that time and would call back - fetal growth, 28 wks- growth 38%, nl dopplers, AC 9%, repeat u/s was planned but pt has not shown back for care - Arabic. Pt notes a preference for a female provider but has agreed to female providers multiple times through her care.    Prenatal Transfer Tool  Maternal Diabetes: No Genetic Screening: Normal Maternal Ultrasounds/Referrals:normal Fetal Ultrasounds or other Referrals:  None Maternal Substance Abuse:  No Significant Maternal Medications:  None Significant Maternal Lab Results: None     OB History    Gravida Para Term Preterm AB Living   2 1 1     2    SAB TAB Ectopic Multiple Live Births                 Past Medical History:  Diagnosis Date  . Hypothyroidism   . Thyroid disease    Past Surgical History:  Procedure Laterality Date  . NO PAST SURGERIES     Family History: family history is not on file. Social History:  reports that she has never smoked. She has never used smokeless tobacco. She reports that she does not drink alcohol or use drugs.  Review of Systems - Negative except leaking fluid, occasional contraction   Dilation: 4 Effacement (%): 70, 80 Station: -2 Exam by:: stone rnc Blood pressure (!) 102/49, pulse 70, temperature 98.7 F (37.1 C), temperature source Oral, resp. rate  18, height 5' 1.42" (1.56 m), weight 81.2 kg (179 lb).  Physical Exam:  Gen: well appearing, no distress  Back: no CVAT Abd: gravid, NT, no RUQ pain LE: no edema, equal bilaterally, non-tender Toco:  q4 min FH: baseline 130s, accelerations present, no deceleratons, 10 beat variability  Prenatal labs: ABO, Rh: --/--/A POS, A POS (03/21 0929) Antibody: NEG (03/21 0929) Rubella: !Error! immune RPR: Nonreactive (01/06 0000)  HBsAg: Negative (08/31 0000)  HIV: Non-reactive (08/31 0000)  GBS: Negative (03/21 0000)  1 hr Glucola 94  Genetic screening normal quad Anatomy US normal  Bedside u/s: vtx  CBC    Component Value Date/Time   WBC 8.9 09/10/2016 0929   RBC 3.71 (L) 09/10/2016 0929   HGB 11.6 (L) 09/10/2016 0929   HCT 33.7 (L) 09/10/2016 0929   PLT 212 09/10/2016 0929   MCV 90.8 09/10/2016 0929   MCV 83.2 05/26/2015 1652   MCH 31.3 09/10/2016 0929   MCHC 34.4 09/10/2016 0929   RDW 13.5 09/10/2016 0929      Assessment/Plan: 26 y.o. G2P1002 at [redacted]w[redacted]d SROM, now 6 hrs post rupture. Will start pitocin. Pt agrees Pt planning to avoid epidural though has done epidural in past, will continue to assess with labor Varicella N-I, recc vaccine PP GBS neg (rapid testing done in MAU) Hypothyroidism, cont synthroid Non compliance with care, pt skipped visits over last 2 months  of pregnancy Provider preference. Pt understands female MD is resuming her care and will be present for delivery. Pt and husband accept female provider and state only they had a preference for a female and understand we cannot accomodate this preference today.  Corderro Koloski A. 09/10/2016 1:00 PM      Kekoa Fyock A. 09/10/2016, 12:51 PM

## 2016-09-10 NOTE — Anesthesia Pain Management Evaluation Note (Signed)
  CRNA Pain Management Visit Note  Patient: Cheryl Murray, 26 y.o., female  "Hello I am a [facility]. We have an anesthesia team available at all times to provide care throughout the hospital, including epidural management and anesthesia for C-section. I don't know your plan for the delivery whether it a natural birth, water birth, IV sedation, nitrous supplementation, doula or epidural, but we want to meet your pain goals."   1.Was your pain managed to your expectations on prior hospitalizations?   Yes   2.What is your expectation for pain management during this hospitalization?     IV pain meds  3.How can we help you reach that goal? Be available;informed of epidural option  Record the patient's initial score and the patient's pain goal.   Pain: 7  Pain Goal: 10 [facility] wants you to be able to say your pain was always managed very well.  Edison PaceWILKERSON,Jaxsen Bernhart 09/10/2016

## 2016-09-11 LAB — CBC
HCT: 33.1 % — ABNORMAL LOW (ref 36.0–46.0)
Hemoglobin: 11.4 g/dL — ABNORMAL LOW (ref 12.0–15.0)
MCH: 31.7 pg (ref 26.0–34.0)
MCHC: 34.4 g/dL (ref 30.0–36.0)
MCV: 91.9 fL (ref 78.0–100.0)
Platelets: 215 10*3/uL (ref 150–400)
RBC: 3.6 MIL/uL — ABNORMAL LOW (ref 3.87–5.11)
RDW: 13.5 % (ref 11.5–15.5)
WBC: 9.9 10*3/uL (ref 4.0–10.5)

## 2016-09-11 LAB — T4, FREE: Free T4: 0.82 ng/dL (ref 0.61–1.12)

## 2016-09-11 LAB — TSH: TSH: 2.231 u[IU]/mL (ref 0.350–4.500)

## 2016-09-11 LAB — RPR: RPR: NONREACTIVE

## 2016-09-11 MED ORDER — LEVOTHYROXINE SODIUM 112 MCG PO TABS: 112.0000 ug | ORAL_TABLET | Freq: Every day | ORAL | 0 refills | Status: AC

## 2016-09-11 NOTE — Progress Notes (Signed)
PPD 1 SVD  S:  Reports feeling good- ready to go home as soon as possible              Tolerating po/ No nausea or vomiting             Bleeding is light             Pain controlled with ibuprofen (OTC)             Up ad lib / ambulatory / voiding QS  Newborn breast feeding  / Circumcision needed today before DC   O:               VS: BP (!) 102/54   Pulse 61   Temp 98.4 F (36.9 C)   Resp 18   Ht 5' 1.42" (1.56 m)   Wt 81.2 kg (179 lb)   SpO2 99%   Breastfeeding   BMI 33.36 kg/m    LABS:              Recent Labs  09/10/16 0929  WBC 8.9  HGB 11.6*  PLT 212               Blood type: --/--/A POS, A POS (03/21 0929)  Rubella: Immune (08/31 0000)                         Physical Exam:             Alert and oriented X3  Abdomen: soft, non-tender, non-distended              Fundus: firm, non-tender, U/1  Perineum: 1st degree perineal repaired- mild edema, ice pack in place   Lochia: light   Extremities: moderate pedal edema, no calf pain or tenderness    A: PPD # 1   Doing well - stable status  Hypothyrodism- levothyroxine   P: DC home today after TSH level   Smart start nurse 1 week PP   [facility] packet discharge instructions reviewed and given   Follow up in office 6 week PP   Levothyroxine adjustment prior to discharge today     Steward DroneVeronica Roneka Gilpin BSN, SNM 09/11/2016, 9:14 AM

## 2016-09-11 NOTE — Discharge Summary (Signed)
Obstetric Discharge Summary Reason for Admission: onset of labor Prenatal Procedures: NST and ultrasound Intrapartum Procedures: spontaneous vaginal delivery Postpartum Procedures: none Complications-Operative and Postpartum: 1st degree perineal laceration Hemoglobin  Date Value Ref Range Status  09/11/2016 11.4 (L) 12.0 - 15.0 g/dL Final   HCT  Date Value Ref Range Status  09/11/2016 33.1 (L) 36.0 - 46.0 % Final    Physical Exam:  General: alert, cooperative and no distress Lochia: appropriate Uterine Fundus: firm Incision: none DVT Evaluation: No evidence of DVT seen on physical exam.  Discharge Diagnoses: Term Pregnancy-delivered and Hypothyrodism   Discharge Information: Date: 09/11/2016 Activity: unrestricted Diet: routine Medications: PNV, Ibuprofen and levothyroxine Condition: stable Instructions: refer to practice specific booklet Discharge to: home Follow-up Information    [facility] MD. Call in 6 week(s).   Specialty:  Obstetrics and Gynecology Contact information: [address] [place] [id number] [phone]           Newborn Data: Live born female  Birth Weight: 7 lb 2.3 oz (3240 g) APGAR: 9, 9  Home with mother.  Steward DroneVeronica Eather Chaires BSN, SNM 09/11/2016, 10:46 AM

## 2016-09-12 LAB — T3, FREE: T3, Free: 2.1 pg/mL (ref 2.0–4.4)
